# Patient Record
Sex: Female | Born: 1975 | Race: White | Hispanic: No | Marital: Single | Smoking: Never smoker
Health system: Southern US, Community
[De-identification: ages and names within clinical notes are randomized; demographics above are authoritative.]

## PROBLEM LIST (undated history)

## (undated) DIAGNOSIS — D649 Anemia, unspecified: Secondary | ICD-10-CM

## (undated) DIAGNOSIS — R519 Headache, unspecified: Secondary | ICD-10-CM

## (undated) DIAGNOSIS — R51 Headache: Secondary | ICD-10-CM

## (undated) HISTORY — DX: Headache, unspecified: R51.9

## (undated) HISTORY — DX: Headache: R51

## (undated) HISTORY — DX: Anemia, unspecified: D64.9

---

## 2017-09-04 DIAGNOSIS — G44209 Tension-type headache, unspecified, not intractable: Secondary | ICD-10-CM | POA: Diagnosis not present

## 2017-09-04 DIAGNOSIS — R42 Dizziness and giddiness: Secondary | ICD-10-CM | POA: Diagnosis not present

## 2017-09-04 DIAGNOSIS — H6980 Other specified disorders of Eustachian tube, unspecified ear: Secondary | ICD-10-CM | POA: Diagnosis not present

## 2017-09-17 ENCOUNTER — Encounter: Payer: Self-pay | Admitting: Medical

## 2017-09-17 ENCOUNTER — Ambulatory Visit (INDEPENDENT_AMBULATORY_CARE_PROVIDER_SITE_OTHER): Payer: BLUE CROSS/BLUE SHIELD | Admitting: Medical

## 2017-09-17 ENCOUNTER — Telehealth: Payer: Self-pay | Admitting: Medical

## 2017-09-17 VITALS — BP 107/49 | HR 72 | Temp 98.5°F | Resp 16 | Ht 66.5 in | Wt 120.8 lb

## 2017-09-17 DIAGNOSIS — S46811A Strain of other muscles, fascia and tendons at shoulder and upper arm level, right arm, initial encounter: Secondary | ICD-10-CM | POA: Diagnosis not present

## 2017-09-17 DIAGNOSIS — J3489 Other specified disorders of nose and nasal sinuses: Secondary | ICD-10-CM | POA: Diagnosis not present

## 2017-09-17 DIAGNOSIS — R51 Headache: Secondary | ICD-10-CM | POA: Diagnosis not present

## 2017-09-17 DIAGNOSIS — R42 Dizziness and giddiness: Secondary | ICD-10-CM | POA: Diagnosis not present

## 2017-09-17 DIAGNOSIS — R519 Headache, unspecified: Secondary | ICD-10-CM

## 2017-09-17 DIAGNOSIS — D649 Anemia, unspecified: Secondary | ICD-10-CM

## 2017-09-17 LAB — COMPREHENSIVE METABOLIC PANEL
ALT: 14 U/L (ref 0–35)
AST: 17 U/L (ref 0–37)
Albumin: 4 g/dL (ref 3.5–5.2)
Alkaline Phosphatase: 45 U/L (ref 39–117)
BUN: 8 mg/dL (ref 6–23)
CO2: 27 mEq/L (ref 19–32)
Calcium: 9.2 mg/dL (ref 8.4–10.5)
Chloride: 102 mEq/L (ref 96–112)
Creatinine, Ser: 0.64 mg/dL (ref 0.40–1.20)
GFR: 109.47 mL/min (ref >60.00)
Glucose, Bld: 96 mg/dL (ref 70–99)
Potassium: 4.5 mEq/L (ref 3.5–5.1)
Sodium: 135 mEq/L (ref 135–145)
Total Bilirubin: 0.2 mg/dL (ref 0.2–1.2)
Total Protein: 6.8 g/dL (ref 6.0–8.3)

## 2017-09-17 LAB — CBC WITH DIFFERENTIAL/PLATELET
Basophils Absolute: 0.1 10*3/uL (ref 0.0–0.1)
Basophils Relative: 0.9 % (ref 0.0–3.0)
Eosinophils Absolute: 0.1 10*3/uL (ref 0.0–0.7)
Eosinophils Relative: 0.7 % (ref 0.0–5.0)
HCT: 29.7 % — ABNORMAL LOW (ref 36.0–46.0)
Hemoglobin: 9.7 g/dL — ABNORMAL LOW (ref 12.0–15.0)
Lymphocytes Relative: 18.4 % (ref 12.0–46.0)
Lymphs Abs: 1.4 10*3/uL (ref 0.7–4.0)
MCHC: 32.6 g/dL (ref 30.0–36.0)
MCV: 80.9 fl (ref 78.0–100.0)
Monocytes Absolute: 0.6 10*3/uL (ref 0.1–1.0)
Monocytes Relative: 7.5 % (ref 3.0–12.0)
Neutro Abs: 5.5 10*3/uL (ref 1.4–7.7)
Neutrophils Relative %: 72.5 % (ref 43.0–77.0)
Platelets: 195 10*3/uL (ref 150.0–400.0)
RBC: 3.67 Mil/uL — ABNORMAL LOW (ref 3.87–5.11)
RDW: 17.1 % — ABNORMAL HIGH (ref 11.5–15.5)
WBC: 7.6 10*3/uL (ref 4.0–10.5)

## 2017-09-17 MED ORDER — MECLIZINE HCL 12.5 MG PO TABS: 12.5000 mg | ORAL_TABLET | Freq: Three times a day (TID) | ORAL | 0 refills | Status: AC | PRN

## 2017-09-17 MED ORDER — CYCLOBENZAPRINE HCL 5 MG PO TABS: 5.0000 mg | ORAL_TABLET | Freq: Every day | ORAL | 0 refills | Status: DC

## 2017-09-17 MED ORDER — DICLOFENAC SODIUM 75 MG PO TBEC: 75.0000 mg | DELAYED_RELEASE_TABLET | Freq: Two times a day (BID) | ORAL | 0 refills | Status: DC

## 2017-09-17 MED ORDER — KETOROLAC TROMETHAMINE 30 MG/ML IJ SOLN
30.0000 mg | Freq: Once | INTRAMUSCULAR | Status: AC
  Administered 2017-09-17: 30 mg via INTRAMUSCULAR

## 2017-09-17 NOTE — Telephone Encounter (Signed)
Future for anemia work up placed.

## 2017-09-17 NOTE — Progress Notes (Signed)
Subjective:    Patient ID: Sara Riley, female    DOB: 1975-11-11, 42 y.o.   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 Lives in [place] for 2 years. Recently stopped coffee over past month. Describes in past 2 pots of Coffee  a day. Now drinking 4 cups of coffee a day. More water recently. She trying to eat better. Pt used to go to gym 3 days a week but recently not. Fiancee. 3 children.(17, 14 and 24 yo).  Steffanie Rainwater recently had stroke. Pt is helping her fiancee with new business, full time job and 3 children. So recent increase in stress.  Pt getting close to 8 hours of sleep a night.  Pt in states she dizziness, ha and nausea  for about a month. She reports she has spinning room sensation Some nausea as well. Last menstrual cycle was Aug 22, 2017. History of [place].  The dizziness/vertigo has been coming and going. Some days has severe symptoms. But will get maybe 2 days free from vertigo then sympoms returns.   HA frequency same as above but will get dizziness first. She does report having some light and sound sensitivity.  Pt states her ears feel clogged. But at urgent care did not note any wax.  Some faint pressure sensation.  Pt in past would have very mild low level ha in past. States in past would get very mild low level ha in past. Rare and respond rapidly to excedrin.  She thinks hearing some decreased. Pt seen at [facility]. They gave advised zyrtec, butalbital/tylenol and motion sickness medicine.(walgreeens  brand medication otc)   Review of Systems  Constitutional: Negative for chills, fatigue and fever.  HENT: Negative for congestion, drooling and ear discharge.   Respiratory: Negative for cough, chest tightness, shortness of breath and wheezing.   Cardiovascular: Negative for chest pain and palpitations.  Gastrointestinal: Negative for abdominal distention, abdominal pain, blood in stool, constipation, diarrhea, nausea and vomiting.    Musculoskeletal: Negative for back pain, joint swelling and neck pain.  Skin: Negative for rash.  Neurological: Positive for dizziness, light-headedness and headaches. Negative for seizures, syncope and weakness.       3/10 level pain now. Was 8/10 level pain earlier today at home.  Hematological: Negative for adenopathy. Does not bruise/bleed easily.  Psychiatric/Behavioral: Negative for behavioral problems, dysphoric mood, sleep disturbance and suicidal ideas. The patient is not nervous/anxious.        Stress.    Past Medical History:  Diagnosis Date  . Anemia   . Headache    migraine in past.     Social History   Socioeconomic History  . Marital status: Single    Spouse name: Not on file  . Number of children: 2  . Years of education: Not on file  . Highest education level: Not on file  Occupational History  . Occupation: byer  Social Needs  . Financial resource strain: Not hard at all  . Food insecurity:    Worry: Never true    Inability: Never true  . Transportation needs:    Medical: No    Non-medical: No  Tobacco Use  . Smoking status: Never Smoker  . Smokeless tobacco: Never Used  Substance and Sexual Activity  . Alcohol use: Never    Frequency: Never  . Drug use: Never  . Sexual activity: Yes    Partners: Male    Comment: fiance  Lifestyle  . Physical activity:  Days per week: 3 days    Minutes per session: 60 min  . Stress: Rather much  Relationships  . Social connections:    Talks on phone: Never    Gets together: Once a week    Attends religious service: Never    Active member of club or organization: No    Attends meetings of clubs or organizations: Never    Relationship status: Living with partner  . Intimate partner violence:    Fear of current or ex partner: No    Emotionally abused: No    Physically abused: No    Forced sexual activity: No  Other Topics Concern  . Not on file  Social History Narrative  . Not on file    Past  Surgical History:  Procedure Laterality Date  . CESAREAN SECTION     2005/2007    Family History  Problem Relation Age of Onset  . Diabetes Father   . Heart failure Father   . Hyperlipidemia Father   . Hypertension Father   . Hearing loss Father   . Birth defects Daughter   . Hypertension Daughter   . Kidney disease Daughter     Not on File  Current Outpatient Medications on File Prior to Visit  Medication Sig Dispense Refill  . Butalbital-APAP-Caffeine 50-300-40 MG CAPS      No current facility-administered medications on file prior to visit.     BP (!) 107/49 (BP Location: Right Arm, Patient Position: Sitting, Cuff Size: Small)   Pulse 72   Temp 98.5 F (36.9 C) (Oral)   Resp 16   Ht 5' 6.5" (1.689 m)   Wt 120 lb 12.8 oz (54.8 kg)   SpO2 100%   BMI 19.21 kg/m       Objective:   Physical Exam   General Mental Status- Alert. General Appearance- Not in acute distress.   Skin General: Color- Normal Color. Moisture- Normal Moisture.  Neck Carotid Arteries- Normal color. Moisture- Normal Moisture. No carotid bruits. No JVD.  Chest and Lung Exam Auscultation: Breath Sounds:-Normal.  Cardiovascular Auscultation:Rythm- Regular. Murmurs & Other Heart Sounds:Auscultation of the heart reveals- No Murmurs.  Abdomen Inspection:-Inspeection Normal. Palpation/Percussion:Note:No mass. Palpation and Percussion of the abdomen reveal- Non Tender, Non Distended + BS, no rebound or guarding.  Neurologic Cranial Nerve exam:- CN III-XII intact(No nystagmus), symmetric smile. Drift Test:- No drift. Romberg Exam:- Negative.  Heal to Toe Gait exam:-Normal. Finger to Nose:- Normal/Intact Strength:- 5/5 equal and symmetric strength both upper and lower extremities. Vertigo lying supine when she turns head even minimally to left.    Assessment & Plan:  For your recent new onset headache with dizziness/vertigo, we gave you Toradol 30 mg IM injection.  Some of your  headache features are migraine headache light versus tension headache.  However since do not have significant history of headaches do want to proceed with caution and follow you closely.  Stop butalbital as you report that did not help.  Tonight before you sleep can use Flexeril 5 mg tablet.  This can help with tension headache.  Also for mild headaches during the day can use diclofenac.  Not to use other NSAIDs while using diclofenac.  For recent vertigo, I am providing you with head maneuver exercises.  Try to do these.  If you find that these are not tolerated then let me know and I could refer you to physical therapy for that maneuvers.   Please get CBC and CMP today.  You overall had  very good neurologic exam today except for vertigo when lying supine and attempting to turn her head to the left.  Will assess your response to the above treatment.  Since your headache features are relatively new I plan to get CT of head if you do not respond to treatment.  Please use Flonase nasal spray for ear pressure and will see if this helps with frontal sinus region pressure as well.  Use meclizine if needed for constant dizziness apart from head position changes.  Esperanza Richters, PA-C

## 2017-09-17 NOTE — Patient Instructions (Addendum)
For your recent new onset headache with dizziness/vertigo, we gave you Toradol 30 mg IM injection.  Some of your headache features are migraine headache light versus tension headache.  However since do not have significant history of headaches do want to proceed with caution and follow you closely.  Stop butalbital as you report that did not help.  Tonight before you sleep can use Flexeril 5 mg tablet.  This can help with tension headache.  Also for mild headaches during the day can use diclofenac.  Not to use other NSAIDs while using diclofenac.  For recent vertigo, I am providing you with head maneuver exercises.  Try to do these.  If you find that these are not tolerated then let me know and I could refer you to physical therapy for that maneuvers.  Please get CBC and CMP today.  You overall had very good neurologic exam today except for vertigo when lying supine and attempting to turn her head to the left.  Will assess your response to the above treatment.  Since your headache features are relatively new I plan to get CT of head if you do not respond to treatment.  Please use Flonase nasal spray for ear pressure and will see if this helps with frontal sinus region pressure as well.  Follow-up on Monday but please give me an update by Friday morning on how you are feeling.  If any severe headache with gross motor or neurologic deficits as described then recommend ED evaluation.   How to Perform the Epley Maneuver The Epley maneuver is an exercise that relieves symptoms of vertigo. Vertigo is the feeling that you or your surroundings are moving when they are not. When you feel vertigo, you may feel like the room is spinning and have trouble walking. Dizziness is a little different than vertigo. When you are dizzy, you may feel unsteady or light-headed. You can do this maneuver at home whenever you have symptoms of vertigo. You can do it up to 3 times a day until your symptoms go  away. Even though the Epley maneuver may relieve your vertigo for a few weeks, it is possible that your symptoms will return. This maneuver relieves vertigo, but it does not relieve dizziness. What are the risks? If it is done correctly, the Epley maneuver is considered safe. Sometimes it can lead to dizziness or nausea that goes away after a short time. If you develop other symptoms, such as changes in vision, weakness, or numbness, stop doing the maneuver and call your health care provider. How to perform the Epley maneuver 1. Sit on the edge of a bed or table with your back straight and your legs extended or hanging over the edge of the bed or table. 2. Turn your head halfway toward the affected ear or side. 3. Lie backward quickly with your head turned until you are lying flat on your back. You may want to position a pillow under your shoulders. 4. Hold this position for 30 seconds. You may experience an attack of vertigo. This is normal. 5. Turn your head to the opposite direction until your unaffected ear is facing the floor. 6. Hold this position for 30 seconds. You may experience an attack of vertigo. This is normal. Hold this position until the vertigo stops. 7. Turn your whole body to the same side as your head. Hold for another 30 seconds. 8. Sit back up. You can repeat this exercise up to 3 times a day. Follow these instructions  at home:  After doing the Epley maneuver, you can return to your normal activities.  Ask your health care provider if there is anything you should do at home to prevent vertigo. He or she may recommend that you: ? Keep your head raised (elevated) with two or more pillows while you sleep. ? Do not sleep on the side of your affected ear. ? Get up slowly from bed. ? Avoid sudden movements during the day. ? Avoid extreme head movement, like looking up or bending over. Contact a health care provider if:  Your vertigo gets worse.  You have other symptoms,  including: ? Nausea. ? Vomiting. ? Headache. Get help right away if:  You have vision changes.  You have a severe or worsening headache or neck pain.  You cannot stop vomiting.  You have new numbness or weakness in any part of your body. Summary  Vertigo is the feeling that you or your surroundings are moving when they are not.  The Epley maneuver is an exercise that relieves symptoms of vertigo.  If the Epley maneuver is done correctly, it is considered safe. You can do it up to 3 times a day. This information is not intended to replace advice given to you by your health care provider. Make sure you discuss any questions you have with your health care provider. Document Released: 03/30/2013 Document Revised: 02/13/2016 Document Reviewed: 02/13/2016 Elsevier Interactive Patient Education  2017 [employer].

## 2017-09-18 ENCOUNTER — Encounter: Payer: Self-pay | Admitting: Medical

## 2017-09-19 ENCOUNTER — Telehealth: Payer: Self-pay | Admitting: Medical

## 2017-09-19 ENCOUNTER — Encounter: Payer: Self-pay | Admitting: Medical

## 2017-09-19 MED ORDER — ONDANSETRON 4 MG PO TBDP: 4.0000 mg | ORAL_TABLET | Freq: Three times a day (TID) | ORAL | 0 refills | Status: DC | PRN

## 2017-09-19 NOTE — Telephone Encounter (Signed)
Did talk to patient. Reiterated my chart message. She mentioned some nausea ealier. Did send in zofran to pharmacy.

## 2017-09-19 NOTE — Telephone Encounter (Signed)
Copied from CRM [phone]. Topic: Quick Communication - See Telephone Encounter >> Sep 19, 2017  1:11 PM Lorrine KinMcGee, Moataz Tavis B, NT wrote: CRM for notification. See Telephone encounter for: 09/19/17. Patient calling and states that she was at work today and only made it for 3 hours before leaving. Would like a call back to discuss whether she needs to continue taking the medications meclizine (ANTIVERT) 12.5 MG tablet  and diclofenac (VOLTAREN) 75 MG EC tablet . States that she can not even keep down water. She does have CT scheduled for Monday afternoon.  CB#: [phone]

## 2017-09-19 NOTE — Telephone Encounter (Signed)
Will you call patient and see how she is doing? I may order ct of head stat for her depending on how she did over weekend. Please call her early on Monday June 17,2019.

## 2017-09-19 NOTE — Telephone Encounter (Signed)
I talked with pt tonight. Will you call her on Monday morning and see how she is? Plan to order Ct of head if did not do well over weekend.

## 2017-09-22 ENCOUNTER — Ambulatory Visit: Payer: BLUE CROSS/BLUE SHIELD | Admitting: Medical

## 2017-09-23 ENCOUNTER — Ambulatory Visit: Payer: BLUE CROSS/BLUE SHIELD | Admitting: Medical

## 2017-09-23 DIAGNOSIS — Z0289 Encounter for other administrative examinations: Secondary | ICD-10-CM

## 2017-09-27 ENCOUNTER — Encounter: Payer: Self-pay | Admitting: Medical

## 2017-10-02 ENCOUNTER — Telehealth: Payer: Self-pay

## 2017-10-03 NOTE — Telephone Encounter (Signed)
I do not see a message.  

## 2017-10-27 ENCOUNTER — Ambulatory Visit: Payer: Self-pay | Admitting: Medical

## 2017-10-27 ENCOUNTER — Encounter (HOSPITAL_BASED_OUTPATIENT_CLINIC_OR_DEPARTMENT_OTHER): Payer: Self-pay

## 2017-10-27 ENCOUNTER — Emergency Department (HOSPITAL_BASED_OUTPATIENT_CLINIC_OR_DEPARTMENT_OTHER): Payer: BLUE CROSS/BLUE SHIELD

## 2017-10-27 ENCOUNTER — Other Ambulatory Visit: Payer: Self-pay

## 2017-10-27 ENCOUNTER — Emergency Department (HOSPITAL_BASED_OUTPATIENT_CLINIC_OR_DEPARTMENT_OTHER)
Admission: EM | Admit: 2017-10-27 | Discharge: 2017-10-27 | Disposition: A | Discharge status: Home / Self Care | Payer: BLUE CROSS/BLUE SHIELD | Attending: Emergency Medicine | Admitting: Emergency Medicine

## 2017-10-27 DIAGNOSIS — Z79899 Other long term (current) drug therapy: Secondary | ICD-10-CM | POA: Insufficient documentation

## 2017-10-27 DIAGNOSIS — R519 Headache, unspecified: Secondary | ICD-10-CM

## 2017-10-27 DIAGNOSIS — R51 Headache: Secondary | ICD-10-CM | POA: Diagnosis not present

## 2017-10-27 DIAGNOSIS — R42 Dizziness and giddiness: Secondary | ICD-10-CM | POA: Diagnosis not present

## 2017-10-27 DIAGNOSIS — R197 Diarrhea, unspecified: Secondary | ICD-10-CM | POA: Diagnosis not present

## 2017-10-27 DIAGNOSIS — R112 Nausea with vomiting, unspecified: Secondary | ICD-10-CM | POA: Diagnosis not present

## 2017-10-27 MED ORDER — SODIUM CHLORIDE 0.9 % IV BOLUS
500.0000 mL | Freq: Once | INTRAVENOUS | Status: AC
  Administered 2017-10-27: 500 mL via INTRAVENOUS

## 2017-10-27 MED ORDER — ONDANSETRON 4 MG PO TBDP: 4.0000 mg | ORAL_TABLET | Freq: Three times a day (TID) | ORAL | 0 refills | Status: AC | PRN

## 2017-10-27 MED ORDER — KETOROLAC TROMETHAMINE 15 MG/ML IJ SOLN
15.0000 mg | Freq: Once | INTRAMUSCULAR | Status: AC
  Administered 2017-10-27: 15 mg via INTRAVENOUS
  Filled 2017-10-27: qty 1

## 2017-10-27 MED ORDER — DEXAMETHASONE 6 MG PO TABS
10.0000 mg | ORAL_TABLET | Freq: Once | ORAL | Status: AC
  Administered 2017-10-27: 10 mg via ORAL
  Filled 2017-10-27: qty 1

## 2017-10-27 MED ORDER — METOCLOPRAMIDE HCL 5 MG/ML IJ SOLN
10.0000 mg | Freq: Once | INTRAMUSCULAR | Status: AC
  Administered 2017-10-27: 10 mg via INTRAVENOUS
  Filled 2017-10-27: qty 2

## 2017-10-27 MED ORDER — CYCLOBENZAPRINE HCL 5 MG PO TABS: 5.0000 mg | ORAL_TABLET | Freq: Two times a day (BID) | ORAL | 0 refills | Status: DC | PRN

## 2017-10-27 MED ORDER — MECLIZINE HCL 25 MG PO TABS
25.0000 mg | ORAL_TABLET | Freq: Once | ORAL | Status: AC
  Administered 2017-10-27: 25 mg via ORAL
  Filled 2017-10-27: qty 1

## 2017-10-27 NOTE — ED Triage Notes (Signed)
Pt states she woke at 230am with HA, n/v/v-to triage in w/c-NAD-steady gait

## 2017-10-27 NOTE — ED Notes (Signed)
ED Provider at bedside. 

## 2017-10-27 NOTE — Telephone Encounter (Signed)
Pt stated that she is having the worst migraine she has ever had. The migraine woke her up at 2:30 this am. She stated that her head is spinning and when she slept she had to sleep on the floor. She stated her Migraine is "greater than a 10/10". She is having vomiting and diarrhea. She stated that she was dry heaving from 0230-0700. Pt stated it feels like her head is a "bowling ball" and she is having difficulty with raising her head . Pt advised to call 911 to take her to the ED. She was given and understood care advice.   Reason for Disposition . [1] SEVERE headache (e.g., excruciating) AND [2] "worst headache" of life . [1] SEVERE headache AND [2] sudden-onset (i.e., reaching maximum intensity within seconds)  Answer Assessment - Initial Assessment Questions 1. LOCATION: "Where does it hurt?"  Head hurts everywhere 2. ONSET: "When did the headache start?" (Minutes, hours or days)      0230 woke pt up 3. PATTERN: "Does the pain come and go, or has it been constant since it started?"     constant 4. SEVERITY: "How bad is the pain?" and "What does it keep you from doing?"  (e.g., Scale 1-10; mild, moderate, or severe)   - MILD (1-3): doesn't interfere with normal activities    - MODERATE (4-7): interferes with normal activities or awakens from sleep    - SEVERE (8-10): excruciating pain, unable to do any normal activities        Severe (beyond 10")- worst headache of her life 5. RECURRENT SYMPTOM: "Have you ever had headaches before?" If so, ask: "When was the last time?" and "What happened that time?"      Yes-month and a half ago 6. CAUSE: "What do you think is causing the headache?"     Pt does not know 7. MIGRAINE: "Have you been diagnosed with migraine headaches?" If so, ask: "Is this headache similar?"      Yes- yes 8. HEAD INJURY: "Has there been any recent injury to the head?"      no 9. OTHER SYMPTOMS: "Do you have any other symptoms?" (fever, stiff neck, eye pain, sore throat,  cold symptoms)     Feels like head is spinning, room is spinning -feels like her head is a bowling ball, nausea and vomiting 10. PREGNANCY: "Is there any chance you are pregnant?" "When was your last menstrual period?"       No- LMP yesterday  Protocols used: HEADACHE-A-AH

## 2017-10-27 NOTE — ED Provider Notes (Signed)
[facility] EMERGENCY DEPARTMENT Provider Note   CSN: [id number] Arrival date & time: 10/27/17  1133     History   Chief Complaint Chief Complaint  Patient presents with  . Headache    HPI Sara Riley is a 42 y.o. female.  The history is provided by the patient. No language interpreter was used.  Headache     Sara Riley is a 42 y.o. female who presents to the Emergency Department complaining of HA. Presents to the emergency department complaining of headache that woke her from sleep at 230 this morning. She reports a pain throughout her head in the back of her neck. It's an onset with associated vomiting, diarrhea, nausea, dizziness. She has experienced similar headaches that began around April of this year. She denies any fevers, abdominal pain, numbness, vision changes. She tried Excedrin migraine with no improvement in her symptoms. She has not had any imaging of her brain. Past Medical History:  Diagnosis Date  . Anemia   . Headache    migraine in past.    There are no active problems to display for this patient.   Past Surgical History:  Procedure Laterality Date  . CESAREAN SECTION     2005/2007     OB History   None      Home Medications    Prior to Admission medications   Medication Sig Start Date End Date Taking? Authorizing Provider  Butalbital-APAP-Caffeine 50-300-40 MG CAPS  09/06/17   [provider]  cyclobenzaprine (FLEXERIL) 5 MG tablet Take 1 tablet (5 mg total) by mouth 2 (two) times daily as needed for muscle spasms. 10/27/17   Tilden Fossaees, Daymein Nunnery, MD  diclofenac (VOLTAREN) 75 MG EC tablet Take 1 tablet (75 mg total) by mouth 2 (two) times daily. 09/17/17   Saguier, Ramon DredgeEdward, PA-C  meclizine (ANTIVERT) 12.5 MG tablet Take 1 tablet (12.5 mg total) by mouth 3 (three) times daily as needed for dizziness. 09/17/17   Saguier, Ramon DredgeEdward, PA-C  ondansetron (ZOFRAN ODT) 4 MG disintegrating tablet Take 1 tablet (4 mg total) by mouth every 8  (eight) hours as needed for nausea or vomiting. 10/27/17   Tilden Fossaees, Estefano Victory, MD    Family History Family History  Problem Relation Age of Onset  . Diabetes Father   . Heart failure Father   . Hyperlipidemia Father   . Hypertension Father   . Hearing loss Father   . Birth defects Daughter   . Hypertension Daughter   . Kidney disease Daughter     Social History Social History   Tobacco Use  . Smoking status: Never Smoker  . Smokeless tobacco: Never Used  Substance Use Topics  . Alcohol use: Never    Frequency: Never  . Drug use: Never     Allergies   Phenergan [promethazine]   Review of Systems Review of Systems  Neurological: Positive for headaches.  All other systems reviewed and are negative.    Physical Exam Updated Vital Signs BP 105/60 (BP Location: Right Arm)   Pulse 81   Temp 98.1 F (36.7 C) (Oral)   Resp 16   Ht 5\' 6"  (1.676 m)   Wt 54.4 kg (120 lb)   LMP 10/26/2017   SpO2 100%   BMI 19.37 kg/m   Physical Exam  Constitutional: She is oriented to person, place, and time. She appears well-developed and well-nourished.  HENT:  Head: Normocephalic and atraumatic.  TMs clear bilaterally. Oropharynx without erythema or edema.  Eyes: Pupils are equal,  round, and reactive to light. EOM are normal.  Neck:  No carotid bruits  Cardiovascular: Normal rate and regular rhythm.  No murmur heard. Pulmonary/Chest: Effort normal and breath sounds normal. No respiratory distress.  Abdominal: Soft. There is no tenderness. There is no rebound and no guarding.  Musculoskeletal: She exhibits no edema or tenderness.  Neurological: She is alert and oriented to person, place, and time. No cranial nerve deficit. Coordination normal.  5/5 strength in all four extremities.   Skin: Skin is warm and dry.  Psychiatric: She has a normal mood and affect. Her behavior is normal.  Nursing note and vitals reviewed.    ED Treatments / Results  Labs (all labs ordered are  listed, but only abnormal results are displayed) Labs Reviewed - No data to display  EKG None  Radiology Ct Head Wo Contrast  Result Date: 10/27/2017 CLINICAL DATA:  Migraine headache and vomiting. EXAM: CT HEAD WITHOUT CONTRAST TECHNIQUE: Contiguous axial images were obtained from the base of the skull through the vertex without intravenous contrast. COMPARISON:  None. FINDINGS: Brain: No evidence of acute infarction, hemorrhage, hydrocephalus, extra-axial collection or mass lesion/mass effect. Vascular: No hyperdense vessel or unexpected calcification. Skull: Normal. Negative for fracture or focal lesion. Sinuses/Orbits: No acute finding. Other: None. IMPRESSION: Normal head CT. Electronically Signed   By: Irish Lack M.D.   On: 10/27/2017 13:50    Procedures Procedures (including critical care time)  Medications Ordered in ED Medications  sodium chloride 0.9 % bolus 500 mL (0 mLs Intravenous Stopped 10/27/17 1423)  metoCLOPramide (REGLAN) injection 10 mg (10 mg Intravenous Given 10/27/17 1333)  meclizine (ANTIVERT) tablet 25 mg (25 mg Oral Given 10/27/17 1355)  ketorolac (TORADOL) 15 MG/ML injection 15 mg (15 mg Intravenous Given 10/27/17 1421)     Initial Impression / Assessment and Plan / ED Course  I have reviewed the triage vital signs and the nursing notes.  Pertinent labs & imaging results that were available during my care of the patient were reviewed by me and considered in my medical decision making (see chart for details).     Patient here for evaluation of recurrent headache with dizziness and vomiting. She is non-toxic appearing on examination with no focal neurologic deficits. Given new onset of unusual headaches since April CT scan of her head was obtained. CT is negative for any acute abnormality. Current presentation is not consistent with subarachnoid hemorrhage, dural sinus thrombosis, CVA, meningitis. Following treatment in the department her symptoms are  significantly improved. Plan to DC home with outpatient follow-up and return precautions.  Final Clinical Impressions(s) / ED Diagnoses   Final diagnoses:  Bad headache    ED Discharge Orders        Ordered    ondansetron (ZOFRAN ODT) 4 MG disintegrating tablet  Every 8 hours PRN     10/27/17 1442    cyclobenzaprine (FLEXERIL) 5 MG tablet  2 times daily PRN     10/27/17 1442       Tilden Fossa, MD 10/27/17 1444

## 2017-11-11 DIAGNOSIS — G43009 Migraine without aura, not intractable, without status migrainosus: Secondary | ICD-10-CM | POA: Diagnosis not present

## 2017-11-11 DIAGNOSIS — R42 Dizziness and giddiness: Secondary | ICD-10-CM | POA: Diagnosis not present

## 2017-11-11 DIAGNOSIS — G43109 Migraine with aura, not intractable, without status migrainosus: Secondary | ICD-10-CM | POA: Diagnosis not present

## 2018-01-14 ENCOUNTER — Ambulatory Visit: Payer: Self-pay | Admitting: Family Medicine

## 2018-05-21 DIAGNOSIS — Z124 Encounter for screening for malignant neoplasm of cervix: Secondary | ICD-10-CM | POA: Diagnosis not present

## 2018-05-21 DIAGNOSIS — Z832 Family history of diseases of the blood and blood-forming organs and certain disorders involving the immune mechanism: Secondary | ICD-10-CM | POA: Diagnosis not present

## 2018-05-21 DIAGNOSIS — Z1151 Encounter for screening for human papillomavirus (HPV): Secondary | ICD-10-CM | POA: Diagnosis not present

## 2018-05-21 DIAGNOSIS — N921 Excessive and frequent menstruation with irregular cycle: Secondary | ICD-10-CM | POA: Diagnosis not present

## 2018-05-21 DIAGNOSIS — N939 Abnormal uterine and vaginal bleeding, unspecified: Secondary | ICD-10-CM | POA: Diagnosis not present

## 2018-05-21 DIAGNOSIS — Z01419 Encounter for gynecological examination (general) (routine) without abnormal findings: Secondary | ICD-10-CM | POA: Diagnosis not present

## 2018-05-21 DIAGNOSIS — D509 Iron deficiency anemia, unspecified: Secondary | ICD-10-CM | POA: Diagnosis not present

## 2018-05-23 ENCOUNTER — Encounter (HOSPITAL_BASED_OUTPATIENT_CLINIC_OR_DEPARTMENT_OTHER): Payer: Self-pay | Admitting: *Deleted

## 2018-05-23 ENCOUNTER — Emergency Department (HOSPITAL_BASED_OUTPATIENT_CLINIC_OR_DEPARTMENT_OTHER)
Admission: EM | Admit: 2018-05-23 | Discharge: 2018-05-23 | Disposition: A | Discharge status: Home / Self Care | Payer: BLUE CROSS/BLUE SHIELD | Attending: Emergency Medicine | Admitting: Emergency Medicine

## 2018-05-23 ENCOUNTER — Other Ambulatory Visit: Payer: Self-pay

## 2018-05-23 DIAGNOSIS — M62830 Muscle spasm of back: Secondary | ICD-10-CM | POA: Insufficient documentation

## 2018-05-23 DIAGNOSIS — Z79899 Other long term (current) drug therapy: Secondary | ICD-10-CM | POA: Insufficient documentation

## 2018-05-23 DIAGNOSIS — M545 Low back pain, unspecified: Secondary | ICD-10-CM

## 2018-05-23 MED ORDER — LIDOCAINE 5 % EX PTCH: 1.0000 | MEDICATED_PATCH | CUTANEOUS | 0 refills | Status: AC

## 2018-05-23 MED ORDER — CYCLOBENZAPRINE HCL 5 MG PO TABS: 5.0000 mg | ORAL_TABLET | Freq: Two times a day (BID) | ORAL | 0 refills | Status: DC | PRN

## 2018-05-23 MED ORDER — KETOROLAC TROMETHAMINE 30 MG/ML IJ SOLN
15.0000 mg | Freq: Once | INTRAMUSCULAR | Status: AC
  Administered 2018-05-23: 15 mg via INTRAMUSCULAR
  Filled 2018-05-23: qty 1

## 2018-05-23 MED ORDER — IBUPROFEN 600 MG PO TABS: 600.0000 mg | ORAL_TABLET | Freq: Four times a day (QID) | ORAL | 0 refills | Status: AC | PRN

## 2018-05-23 NOTE — ED Notes (Signed)
Heating pad applied to lower back.

## 2018-05-23 NOTE — ED Provider Notes (Signed)
[facility] EMERGENCY DEPARTMENT Provider Note   CSN: [id number] Arrival date & time: 05/23/18  1437     History   Chief Complaint Chief Complaint  Patient presents with  . Back Pain    HPI Sara Riley is a 43 y.o. female with history of migraines who presents with low back pain that began after sneezing.  She reports it dropped her to the ground.  She denies any numbness or tingling, saddle anesthesia, bowel or bladder incontinence, history of back procedures, history of cancer or IVDU, fevers.  Patient took Aleve around 10:30 AM when it happened.  She is also applied icy hot.  Patient denies any urinary symptoms.  She has a tubal ligation.  HPI  Past Medical History:  Diagnosis Date  . Anemia   . Headache    migraine in past.    There are no active problems to display for this patient.   Past Surgical History:  Procedure Laterality Date  . CESAREAN SECTION     2005/2007     OB History   No obstetric history on file.      Home Medications    Prior to Admission medications   Medication Sig Start Date End Date Taking? Authorizing Provider  Butalbital-APAP-Caffeine 50-300-40 MG CAPS  09/06/17   [provider]  cyclobenzaprine (FLEXERIL) 5 MG tablet Take 1 tablet (5 mg total) by mouth 2 (two) times daily as needed for muscle spasms. 05/23/18   Leotha Voeltz, Waylan BogaAlexandra M, PA-C  diclofenac (VOLTAREN) 75 MG EC tablet Take 1 tablet (75 mg total) by mouth 2 (two) times daily. 09/17/17   Saguier, Ramon DredgeEdward, PA-C  ibuprofen (ADVIL,MOTRIN) 600 MG tablet Take 1 tablet (600 mg total) by mouth every 6 (six) hours as needed. 05/23/18   Varick Keys, Waylan BogaAlexandra M, PA-C  lidocaine (LIDODERM) 5 % Place 1 patch onto the skin daily. Remove & Discard patch within 12 hours or as directed by MD 05/23/18   Conni ElliotLaw, Waylan BogaAlexandra M, PA-C  meclizine (ANTIVERT) 12.5 MG tablet Take 1 tablet (12.5 mg total) by mouth 3 (three) times daily as needed for dizziness. 09/17/17   Saguier, Ramon DredgeEdward, PA-C  ondansetron  (ZOFRAN ODT) 4 MG disintegrating tablet Take 1 tablet (4 mg total) by mouth every 8 (eight) hours as needed for nausea or vomiting. 10/27/17   Tilden Fossaees, Elizabeth, MD    Family History Family History  Problem Relation Age of Onset  . Diabetes Father   . Heart failure Father   . Hyperlipidemia Father   . Hypertension Father   . Hearing loss Father   . Birth defects Daughter   . Hypertension Daughter   . Kidney disease Daughter     Social History Social History   Tobacco Use  . Smoking status: Never Smoker  . Smokeless tobacco: Never Used  Substance Use Topics  . Alcohol use: Never    Frequency: Never  . Drug use: Never     Allergies   Acetaminophen; Hydrocodone; Penicillin g; Phenergan [promethazine]; and Propoxyphene   Review of Systems Review of Systems  Constitutional: Negative for chills and fever.  HENT: Negative for facial swelling and sore throat.   Respiratory: Negative for shortness of breath.   Cardiovascular: Negative for chest pain.  Gastrointestinal: Negative for abdominal pain, nausea and vomiting.  Genitourinary: Negative for dysuria.  Musculoskeletal: Positive for back pain.  Skin: Negative for rash and wound.  Neurological: Negative for numbness and headaches.  Psychiatric/Behavioral: The patient is not nervous/anxious.  Physical Exam Updated Vital Signs BP (!) 107/54 (BP Location: Right Arm)   Pulse 86   Temp 98.2 F (36.8 C) (Oral)   Resp 18   Ht 5\' 5"  (1.651 m)   Wt 57.2 kg   SpO2 97%   BMI 20.97 kg/m   Physical Exam Vitals signs and nursing note reviewed.  Constitutional:      General: She is not in acute distress.    Appearance: She is well-developed. She is not diaphoretic.  HENT:     Head: Normocephalic and atraumatic.     Mouth/Throat:     Pharynx: No oropharyngeal exudate.  Eyes:     General: No scleral icterus.       Right eye: No discharge.        Left eye: No discharge.     Conjunctiva/sclera: Conjunctivae normal.      Pupils: Pupils are equal, round, and reactive to light.  Neck:     Musculoskeletal: Normal range of motion and neck supple.     Thyroid: No thyromegaly.  Cardiovascular:     Rate and Rhythm: Normal rate and regular rhythm.     Heart sounds: Normal heart sounds. No murmur. No friction rub. No gallop.   Pulmonary:     Effort: Pulmonary effort is normal. No respiratory distress.     Breath sounds: Normal breath sounds. No stridor. No wheezing or rales.  Abdominal:     General: Bowel sounds are normal. There is no distension.     Palpations: Abdomen is soft.     Tenderness: There is no abdominal tenderness. There is no guarding or rebound.  Musculoskeletal:       Back:  Lymphadenopathy:     Cervical: No cervical adenopathy.  Skin:    General: Skin is warm and dry.     Coloration: Skin is not pale.     Findings: No rash.  Neurological:     Mental Status: She is alert.     Coordination: Coordination normal.     Comments: Normal sensation throughout; 5/5 strength in bilateral lower extremities      ED Treatments / Results  Labs (all labs ordered are listed, but only abnormal results are displayed) Labs Reviewed - No data to display  EKG None  Radiology No results found.  Procedures Procedures (including critical care time)  Medications Ordered in ED Medications  ketorolac (TORADOL) 30 MG/ML injection 15 mg (15 mg Intramuscular Given 05/23/18 1521)     Initial Impression / Assessment and Plan / ED Course  I have reviewed the triage vital signs and the nursing notes.  Pertinent labs & imaging results that were available during my care of the patient were reviewed by me and considered in my medical decision making (see chart for details).     Patient with back pain after sneezing.  Patient found to have lumbar spasm.  No neurological deficits and normal neuro exam.  No trauma indicating imaging at this time.  Patient is ambulatory.  No loss of bowel or bladder  control.  No concern for cauda equina.  No fever, night sweats, weight loss, h/o cancer, IVDA, no recent procedure to back. No urinary symptoms suggestive of UTI.  Supportive care and return precaution discussed.  Patient had some improvement with IM Toradol in the ED.  Will discharge home with ibuprofen instead of Aleve, lidocaine patches, and Flexeril.  Patient to follow-up with her PCP if symptoms are not improving over the next few days.  Patient understands  and agrees with plan.  Patient vital stable throughout ED course and discharged in satisfactory condition.  Final Clinical Impressions(s) / ED Diagnoses   Final diagnoses:  Acute bilateral low back pain without sciatica  Muscle spasm of back    ED Discharge Orders         Ordered    cyclobenzaprine (FLEXERIL) 5 MG tablet  2 times daily PRN     05/23/18 1553    ibuprofen (ADVIL,MOTRIN) 600 MG tablet  Every 6 hours PRN     05/23/18 1553    lidocaine (LIDODERM) 5 %  Every 24 hours     05/23/18 1554           Emi Holes, PA-C 05/23/18 1557    Maia Plan, MD 05/23/18 2008

## 2018-05-23 NOTE — Discharge Instructions (Signed)
Take ibuprofen every 6 hours as needed for your pain.  Take Flexeril twice daily as needed for muscle spasms.  Do not drive or operate machinery while taking this medication use ice and heat alternating 20 minutes on, 20 minutes off.  Attempt the stretches and exercises we discussed a few times daily after eating.  Please follow-up with your doctor if your symptoms are not improving over the next few days.  Try to stay as active as possible.  Please return the emergency department you develop any new or worsening symptoms including complete numbness of your legs or groin, loss of bowel or bladder control, or any other new or concerning symptoms.

## 2018-05-23 NOTE — ED Notes (Signed)
ED Provider at bedside. 

## 2018-05-23 NOTE — ED Triage Notes (Signed)
Pt reports she sneezed this morning and since then she has been having severe back pain

## 2018-06-05 DIAGNOSIS — N858 Other specified noninflammatory disorders of uterus: Secondary | ICD-10-CM | POA: Diagnosis not present

## 2018-06-05 DIAGNOSIS — N939 Abnormal uterine and vaginal bleeding, unspecified: Secondary | ICD-10-CM | POA: Diagnosis not present

## 2018-06-05 DIAGNOSIS — N946 Dysmenorrhea, unspecified: Secondary | ICD-10-CM | POA: Diagnosis not present

## 2018-06-05 DIAGNOSIS — N921 Excessive and frequent menstruation with irregular cycle: Secondary | ICD-10-CM | POA: Diagnosis not present

## 2018-06-05 DIAGNOSIS — Z3202 Encounter for pregnancy test, result negative: Secondary | ICD-10-CM | POA: Diagnosis not present

## 2018-06-24 DIAGNOSIS — Z1239 Encounter for other screening for malignant neoplasm of breast: Secondary | ICD-10-CM | POA: Diagnosis not present

## 2018-06-24 DIAGNOSIS — Z1231 Encounter for screening mammogram for malignant neoplasm of breast: Secondary | ICD-10-CM | POA: Diagnosis not present

## 2018-09-01 ENCOUNTER — Ambulatory Visit (INDEPENDENT_AMBULATORY_CARE_PROVIDER_SITE_OTHER): Payer: BLUE CROSS/BLUE SHIELD | Admitting: Medical

## 2018-09-01 ENCOUNTER — Encounter: Payer: Self-pay | Admitting: Medical

## 2018-09-01 ENCOUNTER — Other Ambulatory Visit: Payer: Self-pay

## 2018-09-01 VITALS — Ht 65.0 in | Wt 130.0 lb

## 2018-09-01 DIAGNOSIS — J301 Allergic rhinitis due to pollen: Secondary | ICD-10-CM | POA: Diagnosis not present

## 2018-09-01 MED ORDER — FLUTICASONE PROPIONATE 50 MCG/ACT NA SUSP: 2.0000 | Freq: Every day | NASAL | 1 refills | Status: DC

## 2018-09-01 MED ORDER — LEVOCETIRIZINE DIHYDROCHLORIDE 5 MG PO TABS: 5.0000 mg | ORAL_TABLET | Freq: Every evening | ORAL | 0 refills | Status: DC

## 2018-09-01 MED ORDER — MONTELUKAST SODIUM 10 MG PO TABS: 10.0000 mg | ORAL_TABLET | Freq: Every day | ORAL | 3 refills | Status: DC

## 2018-09-01 NOTE — Progress Notes (Addendum)
Subjective:    Patient ID: Sara Riley, female    DOB: 1975/08/18, 43 y.o.   MRN: [id number]  HPI  Virtual Visit via Video Note  I connected with Sara Riley on 09/01/18 at  4:00 PM EDT by a video enabled telemedicine application and verified that I am speaking with the correct person using two identifiers.  Location: Patient: home Provider: home.  Pt has no way to check her bp or pulse.  Virtual visit done due to pandemic.   I discussed the limitations of evaluation and management by telemedicine and the availability of in person appointments. The patient expressed understanding and agreed to proceed.   History of Present Illness:  Pt in states she has some sinus pressure. She states will get nasal congested daily and having to breath through her mouth. She is sneezing some days non stop. She has been alternating claritin and zyrtec. In February she pulled pack muscle sneezing.  Not blowing out any mucus from nose. No present sinus pain.    Observations/Objective: General-no acute distress, pleasant, oriented. Lungs- on inspection lungs appear unlabored. Neck- no tracheal deviation or jvd on inspection. Neuro- gross motor function appears intact.   Assessment and Plan: For allergic rhinitis, I rx'd xyzal, motelukast and flonase nasal spray.  No sinus pressure presently so doubt sinus infection.  I do think triple combination therapy will be helpful. If not might arrange for depomedrol injection or allergist appointment,.  Follow up in 10 days or as needed   Esperanza RichtersEdward Odilon Cass, PA-C  Follow Up Instructions:    I discussed the assessment and treatment plan with the patient. The patient was provided an opportunity to ask questions and all were answered. The patient agreed with the plan and demonstrated an understanding of the instructions.   The patient was advised to call back or seek an in-person evaluation if the symptoms worsen or if the condition fails to  improve as anticipated.  I provided 15 minutes of non-face-to-face time during this encounter.   Esperanza RichtersEdward Carman Essick, PA-C   Review of Systems  Constitutional: Negative for chills, fatigue and fever.  HENT: Positive for congestion, postnasal drip and sneezing. Negative for ear pain, sinus pressure and sinus pain.   Respiratory: Negative for cough, shortness of breath and wheezing.   Cardiovascular: Negative for chest pain and palpitations.  Gastrointestinal: Negative for abdominal pain.  Musculoskeletal: Negative for back pain.  Neurological: Negative for dizziness, speech difficulty, weakness and headaches.  Hematological: Negative for adenopathy. Does not bruise/bleed easily.  Psychiatric/Behavioral: Negative for behavioral problems and confusion.   Past Medical History:  Diagnosis Date   Anemia    Headache    migraine in past.     Social History   Socioeconomic History   Marital status: Single    Spouse name: Not on file   Number of children: 2   Years of education: Not on file   Highest education level: Not on file  Occupational History   Occupation: [employer]  Ecologistocial Needs   Financial resource strain: Not hard at all   Food insecurity:    Worry: Never true    Inability: Never true   Transportation needs:    Medical: No    Non-medical: No  Tobacco Use   Smoking status: Never Smoker   Smokeless tobacco: Never Used  Substance and Sexual Activity   Alcohol use: Never    Frequency: Never   Drug use: Never   Sexual activity: Not on file  Lifestyle  Physical activity:    Days per week: 3 days    Minutes per session: 60 min   Stress: Rather much  Relationships   Social connections:    Talks on phone: Never    Gets together: Once a week    Attends religious service: Never    Active member of club or organization: No    Attends meetings of clubs or organizations: Never    Relationship status: Living with partner   Intimate partner violence:     Fear of current or ex partner: No    Emotionally abused: No    Physically abused: No    Forced sexual activity: No  Other Topics Concern   Not on file  Social History Narrative   Not on file    Past Surgical History:  Procedure Laterality Date   CESAREAN SECTION     2005/2007    Family History  Problem Relation Age of Onset   Diabetes Father    Heart failure Father    Hyperlipidemia Father    Hypertension Father    Hearing loss Father    Birth defects Daughter    Hypertension Daughter    Kidney disease Daughter     Allergies  Allergen Reactions   Acetaminophen Other (See Comments)   Hydrocodone Other (See Comments)   Penicillin G Other (See Comments)   Phenergan [Promethazine]     tremors   Propoxyphene Other (See Comments)    Current Outpatient Medications on File Prior to Visit  Medication Sig Dispense Refill   Butalbital-APAP-Caffeine 50-300-40 MG CAPS      cyclobenzaprine (FLEXERIL) 5 MG tablet Take 1 tablet (5 mg total) by mouth 2 (two) times daily as needed for muscle spasms. 10 tablet 0   diclofenac (VOLTAREN) 75 MG EC tablet Take 1 tablet (75 mg total) by mouth 2 (two) times daily. 14 tablet 0   ibuprofen (ADVIL,MOTRIN) 600 MG tablet Take 1 tablet (600 mg total) by mouth every 6 (six) hours as needed. 30 tablet 0   lidocaine (LIDODERM) 5 % Place 1 patch onto the skin daily. Remove & Discard patch within 12 hours or as directed by MD 10 patch 0   meclizine (ANTIVERT) 12.5 MG tablet Take 1 tablet (12.5 mg total) by mouth 3 (three) times daily as needed for dizziness. 21 tablet 0   ondansetron (ZOFRAN ODT) 4 MG disintegrating tablet Take 1 tablet (4 mg total) by mouth every 8 (eight) hours as needed for nausea or vomiting. 8 tablet 0   No current facility-administered medications on file prior to visit.     Ht 5\' 5"  (1.651 m)    Wt 130 lb (59 kg)    BMI 21.63 kg/m       Objective:   Physical Exam        Assessment & Plan:

## 2018-09-01 NOTE — Patient Instructions (Signed)
For allergic rhinitis, I rx'd xyzal, motelukast and flonase nasal spray.  No sinus pressure presently so doubt sinus infection.  I do think triple combination therapy will be helpful. If not might arrange for depomedrol injection or allergist appointment,.  Follow up in 10 days or as needed

## 2018-09-03 DIAGNOSIS — N946 Dysmenorrhea, unspecified: Secondary | ICD-10-CM | POA: Diagnosis not present

## 2018-09-03 DIAGNOSIS — N921 Excessive and frequent menstruation with irregular cycle: Secondary | ICD-10-CM | POA: Diagnosis not present

## 2018-09-03 DIAGNOSIS — Z3041 Encounter for surveillance of contraceptive pills: Secondary | ICD-10-CM | POA: Diagnosis not present

## 2018-09-03 DIAGNOSIS — D500 Iron deficiency anemia secondary to blood loss (chronic): Secondary | ICD-10-CM | POA: Diagnosis not present

## 2018-09-09 ENCOUNTER — Ambulatory Visit (INDEPENDENT_AMBULATORY_CARE_PROVIDER_SITE_OTHER): Payer: BC Managed Care – PPO | Admitting: Medical

## 2018-09-09 ENCOUNTER — Other Ambulatory Visit: Payer: Self-pay

## 2018-09-09 DIAGNOSIS — J301 Allergic rhinitis due to pollen: Secondary | ICD-10-CM

## 2018-09-09 DIAGNOSIS — E611 Iron deficiency: Secondary | ICD-10-CM

## 2018-09-09 MED ORDER — LEVOCETIRIZINE DIHYDROCHLORIDE 5 MG PO TABS: 5.0000 mg | ORAL_TABLET | Freq: Every evening | ORAL | 3 refills | Status: AC

## 2018-09-09 MED ORDER — MONTELUKAST SODIUM 10 MG PO TABS: 10.0000 mg | ORAL_TABLET | Freq: Every day | ORAL | 3 refills | Status: AC

## 2018-09-09 MED ORDER — FLUTICASONE PROPIONATE 50 MCG/ACT NA SUSP: 2.0000 | Freq: Every day | NASAL | 11 refills | Status: DC

## 2018-09-09 NOTE — Patient Instructions (Addendum)
Allergies much improved/resoved symptoms since last visit Refilled 3 allergy med regmin for one year. Advised take daily and avoid flares.  For low iron follow up with hematologist for likely iron infusion. Update me by my chart if they gave.  Follow up next appointment will recommend wellness exam. Early fall or spring.

## 2018-09-09 NOTE — Progress Notes (Signed)
   Subjective:    Patient ID: Sara Riley, female    DOB: 02/21/1976, 43 y.o.   MRN: [id number]  HPI  Virtual Visit via Video Note  I connected with Sara Riley on 09/09/18 at  3:20 PM EDT by a video enabled telemedicine application and verified that I am speaking with the correct person using two identifiers.  Location: Patient: [facility] Provider: HOME.   I discussed the limitations of evaluation and management by telemedicine and the availability of in person appointments. The patient expressed understanding and agreed to proceed.  Marland Kitchen History of Present Illness:  Pt states her allergies cleared up with her 3 medication regimen. No sinus pressure. All allergy symptoms resolved.  Pt not having any HA presently. She has low iron despite use of oral iron. She has appointment with hematologist in [place] for possible iron infusion. He gyn that has been following iron level think her ha maybe low iron related.     Observations/Objective: General-no acute distress, pleasant, oriented. Lungs- on inspection lungs appear unlabored. Neck- no tracheal deviation or jvd on inspection. Neuro- gross motor function appears intact.  Assessment and Plan: Allergies much improved/resoved symptoms since last visit Refilled 3 allergy med regmin for one year. Advised take daily and avoid flares.  For low iron follow up with hematologist for likely iron infusion. Update me by my chart if they gave.  Follow up next appointment will recommend wellness exam. Early fall or spring.  Follow Up Instructions:    I discussed the assessment and treatment plan with the patient. The patient was provided an opportunity to ask questions and all were answered. The patient agreed with the plan and demonstrated an understanding of the instructions.   The patient was advised to call back or seek an in-person evaluation if the symptoms worsen or if the condition fails to improve as anticipated.     Esperanza Richters, PA-C    Review of Systems  Constitutional: Positive for fatigue. Negative for chills and fever.       With low iron.  HENT: Negative for congestion, postnasal drip, sinus pressure, sinus pain, sneezing and sore throat.   Respiratory: Negative for cough, chest tightness and wheezing.   Cardiovascular: Negative for chest pain and palpitations.  Gastrointestinal: Negative for abdominal pain.  Musculoskeletal: Negative for back pain and neck pain.  Skin: Negative for rash.  Neurological: Negative for dizziness and headaches.  Hematological: Negative for adenopathy. Does not bruise/bleed easily.  Psychiatric/Behavioral: Negative for behavioral problems.       Objective:   Physical Exam        Assessment & Plan:

## 2018-09-11 ENCOUNTER — Ambulatory Visit: Payer: BLUE CROSS/BLUE SHIELD | Admitting: Medical

## 2018-09-11 DIAGNOSIS — D509 Iron deficiency anemia, unspecified: Secondary | ICD-10-CM | POA: Diagnosis not present

## 2018-09-11 DIAGNOSIS — N921 Excessive and frequent menstruation with irregular cycle: Secondary | ICD-10-CM | POA: Diagnosis not present

## 2018-09-11 DIAGNOSIS — D500 Iron deficiency anemia secondary to blood loss (chronic): Secondary | ICD-10-CM | POA: Diagnosis not present

## 2018-09-14 ENCOUNTER — Encounter: Payer: Self-pay | Admitting: Medical

## 2018-09-15 ENCOUNTER — Ambulatory Visit: Payer: BC Managed Care – PPO | Admitting: Medical

## 2018-09-18 DIAGNOSIS — D509 Iron deficiency anemia, unspecified: Secondary | ICD-10-CM | POA: Diagnosis not present

## 2018-09-18 DIAGNOSIS — D500 Iron deficiency anemia secondary to blood loss (chronic): Secondary | ICD-10-CM | POA: Diagnosis not present

## 2018-09-18 DIAGNOSIS — N921 Excessive and frequent menstruation with irregular cycle: Secondary | ICD-10-CM | POA: Diagnosis not present

## 2018-09-25 DIAGNOSIS — D500 Iron deficiency anemia secondary to blood loss (chronic): Secondary | ICD-10-CM | POA: Diagnosis not present

## 2018-09-25 DIAGNOSIS — N921 Excessive and frequent menstruation with irregular cycle: Secondary | ICD-10-CM | POA: Diagnosis not present

## 2018-09-25 DIAGNOSIS — D509 Iron deficiency anemia, unspecified: Secondary | ICD-10-CM | POA: Diagnosis not present

## 2018-09-29 DIAGNOSIS — H9311 Tinnitus, right ear: Secondary | ICD-10-CM | POA: Diagnosis not present

## 2018-09-29 DIAGNOSIS — H9041 Sensorineural hearing loss, unilateral, right ear, with unrestricted hearing on the contralateral side: Secondary | ICD-10-CM | POA: Diagnosis not present

## 2018-09-29 DIAGNOSIS — H9121 Sudden idiopathic hearing loss, right ear: Secondary | ICD-10-CM | POA: Diagnosis not present

## 2018-12-18 ENCOUNTER — Other Ambulatory Visit: Payer: Self-pay

## 2018-12-18 ENCOUNTER — Ambulatory Visit: Payer: BC Managed Care – PPO | Admitting: Medical

## 2018-12-18 ENCOUNTER — Encounter: Payer: Self-pay | Admitting: Medical

## 2018-12-18 VITALS — BP 113/53 | HR 73

## 2018-12-18 DIAGNOSIS — D500 Iron deficiency anemia secondary to blood loss (chronic): Secondary | ICD-10-CM | POA: Diagnosis not present

## 2018-12-18 DIAGNOSIS — T7840XA Allergy, unspecified, initial encounter: Secondary | ICD-10-CM | POA: Diagnosis not present

## 2018-12-18 DIAGNOSIS — Z79899 Other long term (current) drug therapy: Secondary | ICD-10-CM | POA: Diagnosis not present

## 2018-12-18 DIAGNOSIS — N921 Excessive and frequent menstruation with irregular cycle: Secondary | ICD-10-CM | POA: Diagnosis not present

## 2018-12-18 MED ORDER — HYDROXYZINE HCL 25 MG PO TABS: 25.0000 mg | ORAL_TABLET | Freq: Three times a day (TID) | ORAL | 0 refills | Status: AC | PRN

## 2018-12-18 MED ORDER — METHYLPREDNISOLONE ACETATE 40 MG/ML IJ SUSP
40.0000 mg | Freq: Once | INTRAMUSCULAR | Status: AC
  Administered 2018-12-18: 40 mg via INTRAMUSCULAR

## 2018-12-18 MED ORDER — PREDNISONE 10 MG PO TABS: ORAL_TABLET | ORAL | 0 refills | Status: AC

## 2018-12-18 NOTE — Progress Notes (Signed)
Subjective:    Patient ID: Sara MillerAmber M Riley, female    DOB: 04/14/1975, 43 y.o.   MRN: [id number]  HPI  Patient here with complaints of rash and itching. States she was doing yard work Monday. Rash started on fingers of right hand. By Monday night, she had a few areas under her right eye and face. Now has spread to left upper arm and bilateral inner upper legs. States she is not scratching, has been washing clothes and sheets, but thinks she may be accidentally scratching in her sleep. No new soaps, detergents, foods. She has tried aloe and bleach, which usually helps her poison ivy, but it has not helped at all this time. Most irritated on fingers and face; blisters itch and pain 10/10 when touched.  Has had poison ivy/oak multiple times but has never had to come in for evaluation.  LMP- November 30, 2018.    Review of Systems  Constitutional: Negative for chills, fatigue and fever.  Respiratory: Negative for chest tightness and shortness of breath.   Cardiovascular: Negative for chest pain and palpitations.  Musculoskeletal: Negative for arthralgias and joint swelling.  Skin: Positive for rash.       Painful, itchy blisters to right hand (middle and ring finger), left upper arm, bilateral inner thighs, right face, right eyelid  Neurological: Negative for dizziness and light-headedness.    Past Medical History:  Diagnosis Date  . Anemia   . Headache    migraine in past.     Social History   Socioeconomic History  . Marital status: Single    Spouse name: Not on file  . Number of children: 2  . Years of education: Not on file  . Highest education level: Not on file  Occupational History  . Occupation: [employer]  Social Needs  . Financial resource strain: Not hard at all  . Food insecurity    Worry: Never true    Inability: Never true  . Transportation needs    Medical: No    Non-medical: No  Tobacco Use  . Smoking status: Never Smoker  . Smokeless tobacco: Never Used   Substance and Sexual Activity  . Alcohol use: Never    Frequency: Never  . Drug use: Never  . Sexual activity: Not on file  Lifestyle  . Physical activity    Days per week: 3 days    Minutes per session: 60 min  . Stress: Rather much  Relationships  . Social Musicianconnections    Talks on phone: Never    Gets together: Once a week    Attends religious service: Never    Active member of club or organization: No    Attends meetings of clubs or organizations: Never    Relationship status: Living with partner  . Intimate partner violence    Fear of current or ex partner: No    Emotionally abused: No    Physically abused: No    Forced sexual activity: No  Other Topics Concern  . Not on file  Social History Narrative  . Not on file    Past Surgical History:  Procedure Laterality Date  . CESAREAN SECTION     2005/2007    Family History  Problem Relation Age of Onset  . Diabetes Father   . Heart failure Father   . Hyperlipidemia Father   . Hypertension Father   . Hearing loss Father   . Birth defects Daughter   . Hypertension Daughter   . Kidney disease Daughter  Allergies  Allergen Reactions  . Acetaminophen Other (See Comments)  . Hydrocodone Other (See Comments)  . Penicillin G Other (See Comments)  . Phenergan [Promethazine]     tremors  . Propoxyphene Other (See Comments)    Current Outpatient Medications on File Prior to Visit  Medication Sig Dispense Refill  . ibuprofen (ADVIL,MOTRIN) 600 MG tablet Take 1 tablet (600 mg total) by mouth every 6 (six) hours as needed. 30 tablet 0  . levocetirizine (XYZAL) 5 MG tablet Take 1 tablet (5 mg total) by mouth every evening. 90 tablet 3  . lidocaine (LIDODERM) 5 % Place 1 patch onto the skin daily. Remove & Discard patch within 12 hours or as directed by MD 10 patch 0  . meclizine (ANTIVERT) 12.5 MG tablet Take 1 tablet (12.5 mg total) by mouth 3 (three) times daily as needed for dizziness. 21 tablet 0  . montelukast  (SINGULAIR) 10 MG tablet Take 1 tablet (10 mg total) by mouth at bedtime. 90 tablet 3  . ondansetron (ZOFRAN ODT) 4 MG disintegrating tablet Take 1 tablet (4 mg total) by mouth every 8 (eight) hours as needed for nausea or vomiting. 8 tablet 0   No current facility-administered medications on file prior to visit.     There were no vitals taken for this visit.      Objective:   Physical Exam   General- No acute distress. Pleasant patient. Skin- rt side face, left bicep, medial thighs, and rt 3rd digit are region of papular rash. However rt 3rd digit mild blistered appearance. Not so vesicles on pt pt.      Assessment & Plan:  You recently have probable  allergic reaction to poison ivy or poison oak based on your described hx. We gave you depo-medrol im injection. I am also prescribing oral prednisone and hydroxyzine for itching. Your rash should gradually improve. If worsening or expanding please notify us.  Rx advisement given  Follow up in 7 days or as needed.  Mackie Pai, PA-C

## 2018-12-18 NOTE — Patient Instructions (Addendum)
You recently have probable  allergic reaction to poison ivy or poison oak based on your described hx. We gave you depo-medrol im injection. I am also prescribing oral prednisone and hydroxyzine for itching. Your rash should gradually improve. If worsening or expanding please notify us.  Rx advisement given  Follow up in 7 days or as needed.

## 2019-04-19 DIAGNOSIS — D500 Iron deficiency anemia secondary to blood loss (chronic): Secondary | ICD-10-CM | POA: Diagnosis not present

## 2019-04-19 DIAGNOSIS — D751 Secondary polycythemia: Secondary | ICD-10-CM | POA: Diagnosis not present

## 2019-04-19 DIAGNOSIS — N921 Excessive and frequent menstruation with irregular cycle: Secondary | ICD-10-CM | POA: Diagnosis not present

## 2019-04-19 DIAGNOSIS — Z793 Long term (current) use of hormonal contraceptives: Secondary | ICD-10-CM | POA: Diagnosis not present

## 2019-04-19 DIAGNOSIS — D696 Thrombocytopenia, unspecified: Secondary | ICD-10-CM | POA: Diagnosis not present

## 2019-04-19 DIAGNOSIS — Z79899 Other long term (current) drug therapy: Secondary | ICD-10-CM | POA: Diagnosis not present

## 2019-06-25 DIAGNOSIS — Z1231 Encounter for screening mammogram for malignant neoplasm of breast: Secondary | ICD-10-CM | POA: Diagnosis not present

## 2019-11-29 DIAGNOSIS — Z793 Long term (current) use of hormonal contraceptives: Secondary | ICD-10-CM | POA: Diagnosis not present

## 2019-11-29 DIAGNOSIS — N925 Other specified irregular menstruation: Secondary | ICD-10-CM | POA: Diagnosis not present

## 2019-11-29 DIAGNOSIS — Z01419 Encounter for gynecological examination (general) (routine) without abnormal findings: Secondary | ICD-10-CM | POA: Diagnosis not present

## 2020-03-17 IMAGING — CT CT HEAD W/O CM
3 series · 16 of 47 positions shown, 19 images · non-contrast
Comparison: None.

CLINICAL DATA: Migraine headache and vomiting.

EXAM:
CT HEAD WITHOUT CONTRAST
TECHNIQUE: Contiguous axial images were obtained from the base of the skull
through the vertex without intravenous contrast.

[Series 2: head wo · axial · 0.44mm/px · z∈[-154,-24]mm · 10 of 32 slices shown, 13 images]
[im 3/32  brain]
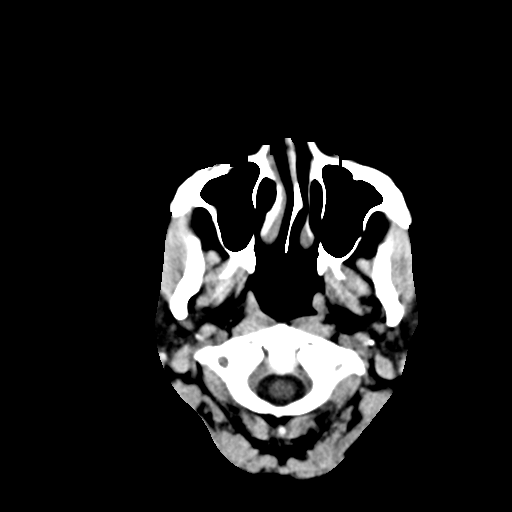
[im 3/32  bone]
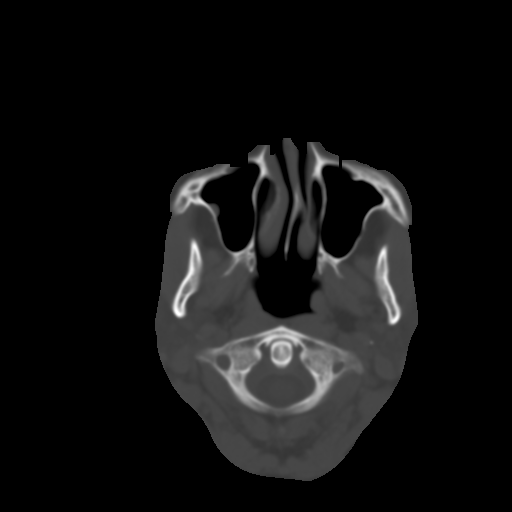
[im 6/32  brain]
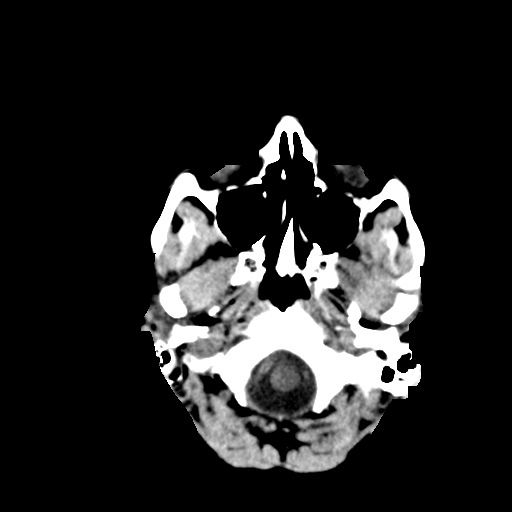
[im 9/32  brain]
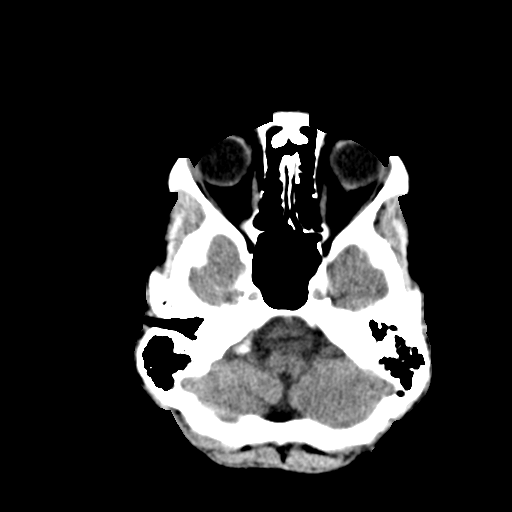
[im 11/32  brain]
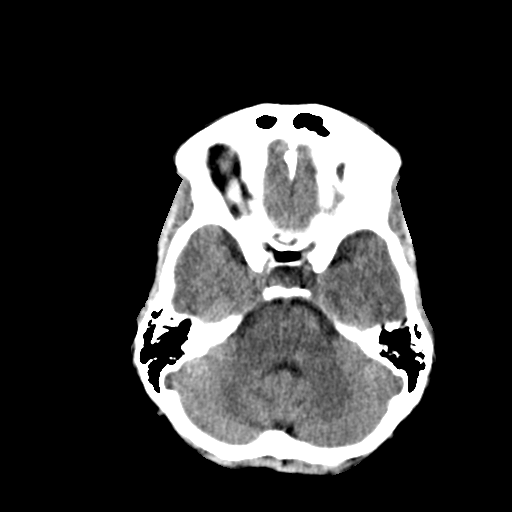
[im 14/32  brain]
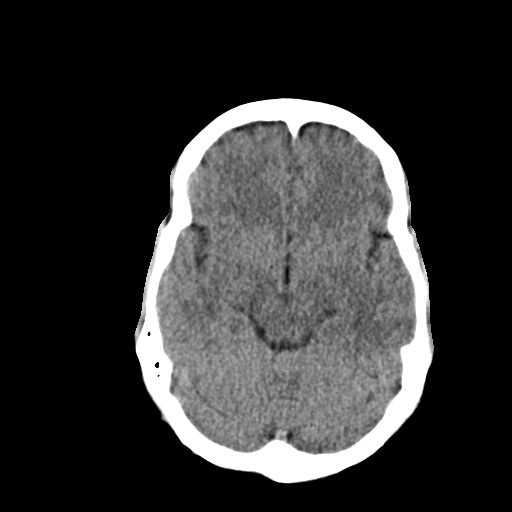
[im 14/32  bone]
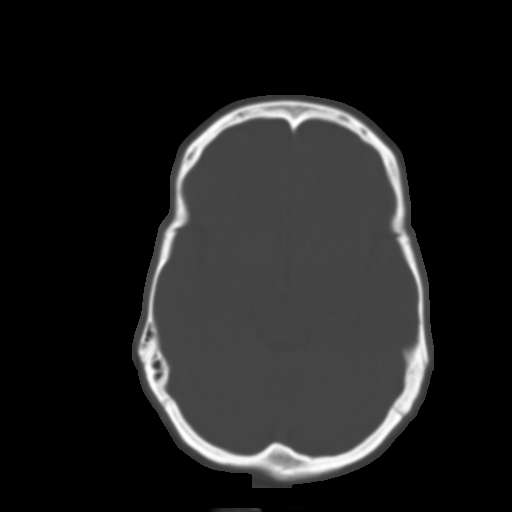
[im 18/32  brain]
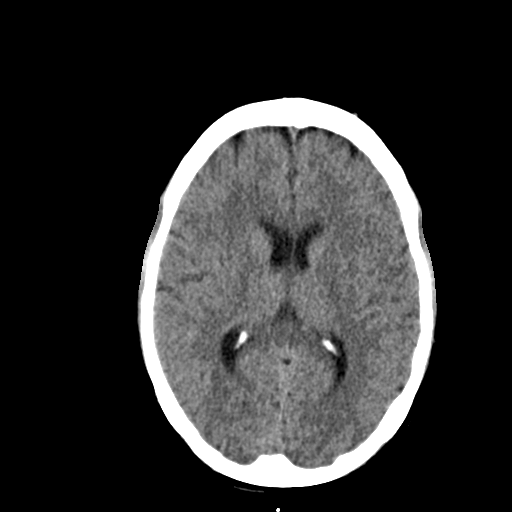
[im 21/32  brain]
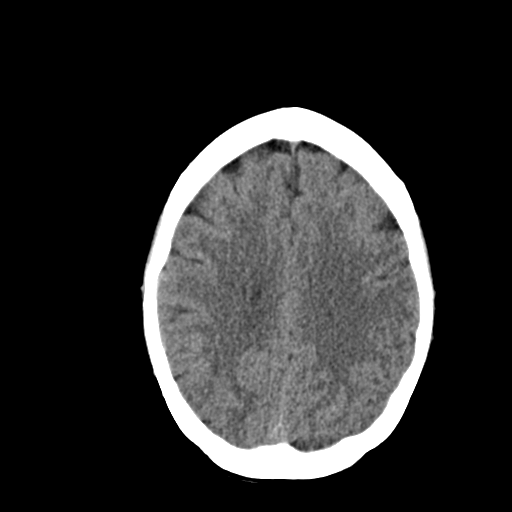
[im 24/32  brain]
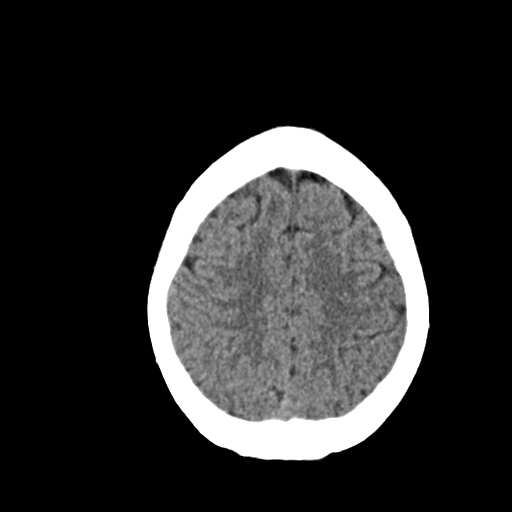
[im 26/32  brain]
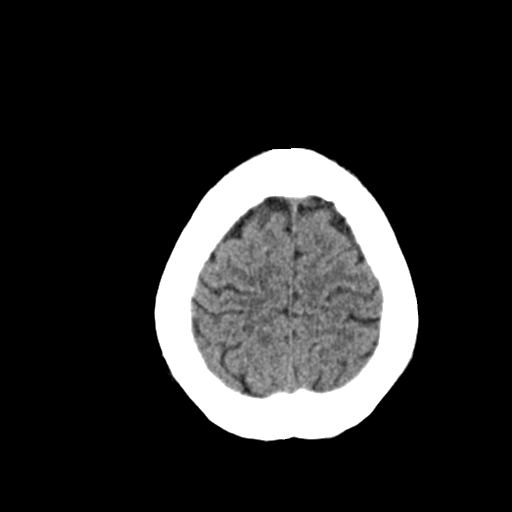
[im 26/32  bone]
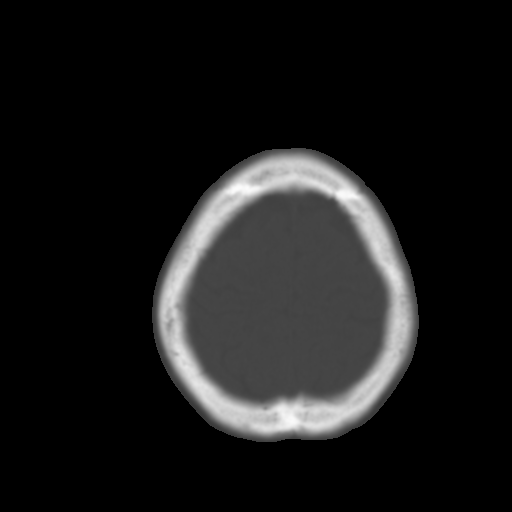
[im 29/32  brain]
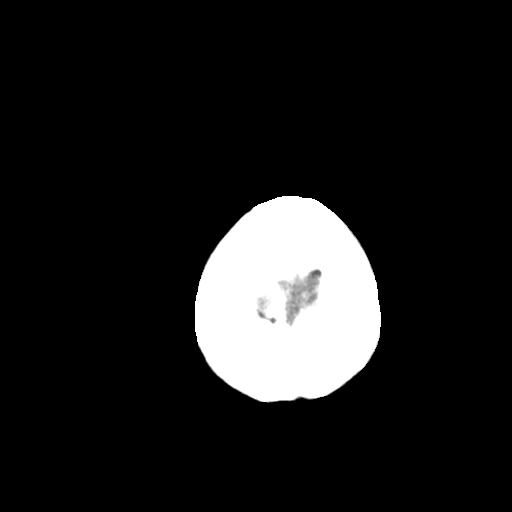

[Series 4: coronal soft · coronal · 0.33mm/px · 3 of 62 slices shown]
[im 21/62  brain]
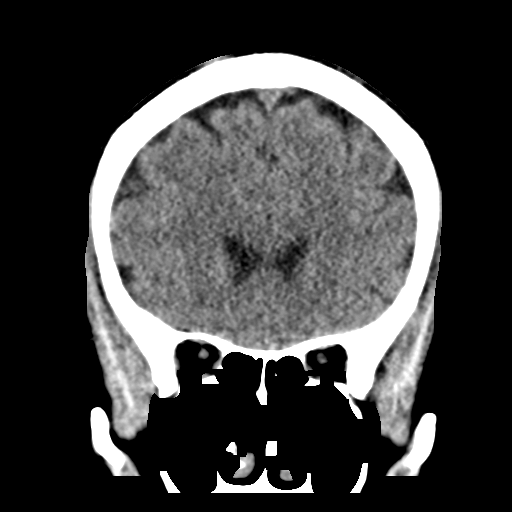
[im 28/62  brain]
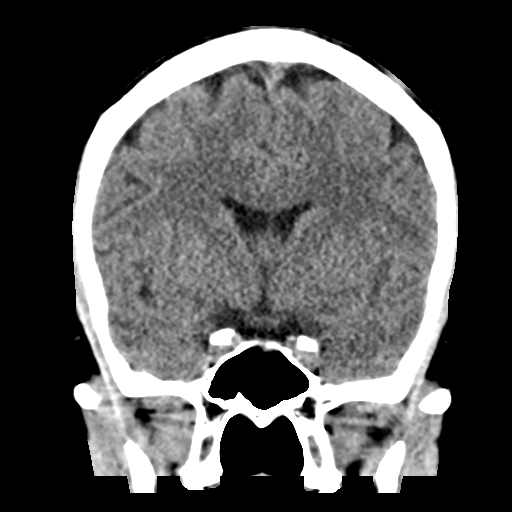
[im 34/62  brain]
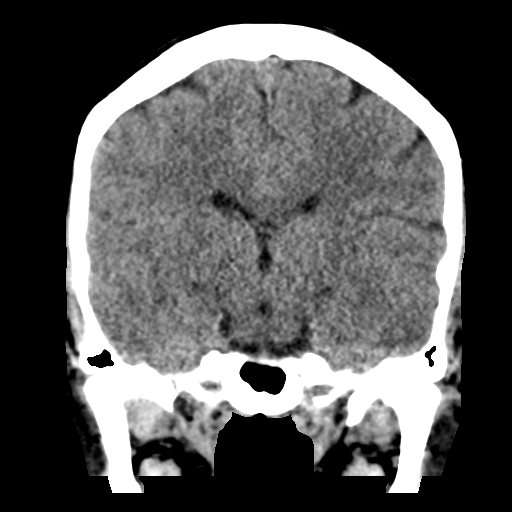

[Series 5: sag soft · sagittal · 0.31mm/px · 3 of 52 slices shown]
[im 18/52  brain]
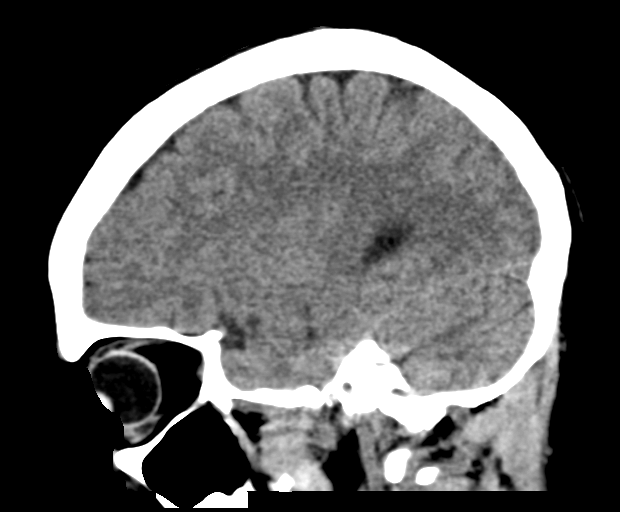
[im 26/52  brain]
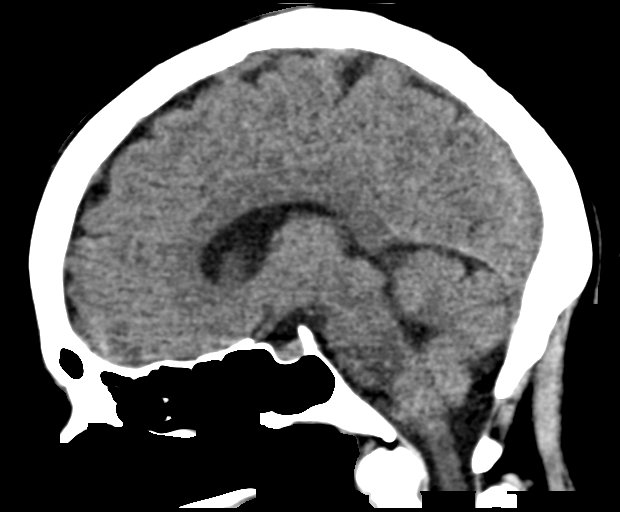
[im 35/52  brain]
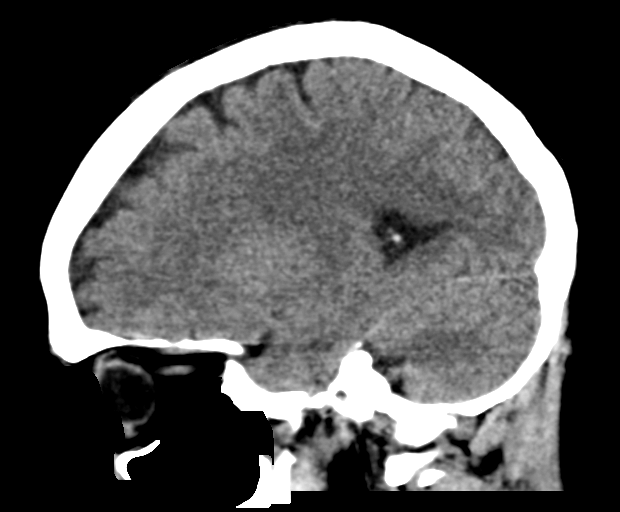

[16 of 47 positions shown; findings below may reference images not displayed]

FINDINGS: Brain: No evidence of acute infarction, hemorrhage, hydrocephalus,
extra-axial collection or mass lesion/mass effect.

Vascular: No hyperdense vessel or unexpected calcification.

Skull: Normal. Negative for fracture or focal lesion.

Sinuses/Orbits: No acute finding.

Other: None.
IMPRESSION: Normal head CT.

## 2020-04-14 ENCOUNTER — Other Ambulatory Visit: Payer: Self-pay

## 2020-04-14 ENCOUNTER — Other Ambulatory Visit: Payer: Medicaid Other

## 2020-04-14 DIAGNOSIS — Z20822 Contact with and (suspected) exposure to covid-19: Secondary | ICD-10-CM | POA: Diagnosis not present

## 2020-04-17 LAB — NOVEL CORONAVIRUS, NAA: SARS-CoV-2, NAA: NOT DETECTED

## 2020-04-20 ENCOUNTER — Other Ambulatory Visit: Payer: Medicaid Other

## 2020-04-20 DIAGNOSIS — Z20822 Contact with and (suspected) exposure to covid-19: Secondary | ICD-10-CM

## 2020-04-25 LAB — NOVEL CORONAVIRUS, NAA: SARS-CoV-2, NAA: NOT DETECTED

## 2020-05-01 ENCOUNTER — Other Ambulatory Visit: Payer: Self-pay

## 2020-05-01 ENCOUNTER — Other Ambulatory Visit: Payer: Medicaid Other

## 2020-05-01 DIAGNOSIS — Z1152 Encounter for screening for COVID-19: Secondary | ICD-10-CM

## 2020-05-02 LAB — NOVEL CORONAVIRUS, NAA: SARS-CoV-2, NAA: NOT DETECTED

## 2020-05-02 LAB — SARS-COV-2, NAA 2 DAY TAT

## 2020-05-08 ENCOUNTER — Other Ambulatory Visit: Payer: Medicaid Other

## 2020-05-08 DIAGNOSIS — Z20822 Contact with and (suspected) exposure to covid-19: Secondary | ICD-10-CM

## 2020-05-10 LAB — SARS-COV-2, NAA 2 DAY TAT

## 2020-05-10 LAB — NOVEL CORONAVIRUS, NAA: SARS-CoV-2, NAA: NOT DETECTED

## 2020-05-15 ENCOUNTER — Other Ambulatory Visit: Payer: Medicaid Other

## 2020-05-16 ENCOUNTER — Other Ambulatory Visit: Payer: Medicaid Other

## 2020-05-18 ENCOUNTER — Other Ambulatory Visit: Payer: Medicaid Other

## 2020-05-18 DIAGNOSIS — Z20822 Contact with and (suspected) exposure to covid-19: Secondary | ICD-10-CM | POA: Diagnosis not present

## 2020-05-19 LAB — NOVEL CORONAVIRUS, NAA: SARS-CoV-2, NAA: NOT DETECTED

## 2020-05-19 LAB — SARS-COV-2, NAA 2 DAY TAT

## 2020-05-22 ENCOUNTER — Other Ambulatory Visit: Payer: Medicaid Other

## 2020-05-22 DIAGNOSIS — Z20822 Contact with and (suspected) exposure to covid-19: Secondary | ICD-10-CM

## 2020-05-23 ENCOUNTER — Other Ambulatory Visit: Payer: Medicaid Other

## 2020-05-23 LAB — SARS-COV-2, NAA 2 DAY TAT

## 2020-05-23 LAB — NOVEL CORONAVIRUS, NAA: SARS-CoV-2, NAA: NOT DETECTED

## 2020-05-30 ENCOUNTER — Other Ambulatory Visit: Payer: Medicaid Other

## 2020-05-30 DIAGNOSIS — Z20822 Contact with and (suspected) exposure to covid-19: Secondary | ICD-10-CM

## 2020-05-31 LAB — NOVEL CORONAVIRUS, NAA: SARS-CoV-2, NAA: NOT DETECTED

## 2020-05-31 LAB — SARS-COV-2, NAA 2 DAY TAT

## 2020-06-01 ENCOUNTER — Other Ambulatory Visit: Payer: Medicaid Other

## 2020-06-06 ENCOUNTER — Other Ambulatory Visit: Payer: Medicaid Other

## 2020-06-06 DIAGNOSIS — Z20822 Contact with and (suspected) exposure to covid-19: Secondary | ICD-10-CM

## 2020-06-07 LAB — NOVEL CORONAVIRUS, NAA: SARS-CoV-2, NAA: NOT DETECTED

## 2020-06-07 LAB — SARS-COV-2, NAA 2 DAY TAT

## 2020-06-13 ENCOUNTER — Other Ambulatory Visit: Payer: Medicaid Other

## 2020-06-13 DIAGNOSIS — Z20822 Contact with and (suspected) exposure to covid-19: Secondary | ICD-10-CM | POA: Diagnosis not present

## 2020-06-14 LAB — NOVEL CORONAVIRUS, NAA: SARS-CoV-2, NAA: NOT DETECTED

## 2020-06-14 LAB — SARS-COV-2, NAA 2 DAY TAT

## 2020-06-20 ENCOUNTER — Ambulatory Visit: Payer: Medicaid Other | Attending: Critical Care Medicine

## 2020-06-20 DIAGNOSIS — Z20822 Contact with and (suspected) exposure to covid-19: Secondary | ICD-10-CM

## 2020-06-21 LAB — SARS-COV-2, NAA 2 DAY TAT

## 2020-06-21 LAB — NOVEL CORONAVIRUS, NAA: SARS-CoV-2, NAA: NOT DETECTED

## 2020-06-27 ENCOUNTER — Ambulatory Visit: Payer: Medicaid Other | Attending: Internal Medicine

## 2020-06-27 DIAGNOSIS — Z20822 Contact with and (suspected) exposure to covid-19: Secondary | ICD-10-CM | POA: Diagnosis not present

## 2020-06-28 LAB — SARS-COV-2, NAA 2 DAY TAT

## 2020-06-28 LAB — NOVEL CORONAVIRUS, NAA: SARS-CoV-2, NAA: NOT DETECTED

## 2020-07-04 ENCOUNTER — Ambulatory Visit: Payer: Medicaid Other | Attending: Internal Medicine

## 2020-07-04 DIAGNOSIS — Z20822 Contact with and (suspected) exposure to covid-19: Secondary | ICD-10-CM | POA: Diagnosis not present

## 2020-07-05 LAB — SARS-COV-2, NAA 2 DAY TAT

## 2020-07-05 LAB — NOVEL CORONAVIRUS, NAA: SARS-CoV-2, NAA: NOT DETECTED

## 2020-07-11 ENCOUNTER — Ambulatory Visit: Payer: Medicaid Other | Attending: Critical Care Medicine

## 2020-07-11 DIAGNOSIS — Z20822 Contact with and (suspected) exposure to covid-19: Secondary | ICD-10-CM

## 2020-07-12 LAB — SARS-COV-2, NAA 2 DAY TAT

## 2020-07-12 LAB — NOVEL CORONAVIRUS, NAA: SARS-CoV-2, NAA: NOT DETECTED

## 2020-07-13 DIAGNOSIS — Z1231 Encounter for screening mammogram for malignant neoplasm of breast: Secondary | ICD-10-CM | POA: Diagnosis not present

## 2020-07-18 ENCOUNTER — Other Ambulatory Visit: Payer: Medicaid Other

## 2020-07-20 ENCOUNTER — Ambulatory Visit: Payer: Medicaid Other | Attending: Internal Medicine

## 2020-07-20 DIAGNOSIS — Z20822 Contact with and (suspected) exposure to covid-19: Secondary | ICD-10-CM | POA: Diagnosis not present

## 2020-07-22 LAB — NOVEL CORONAVIRUS, NAA: SARS-CoV-2, NAA: NOT DETECTED

## 2020-07-22 LAB — SARS-COV-2, NAA 2 DAY TAT

## 2020-07-25 ENCOUNTER — Ambulatory Visit: Payer: Medicaid Other | Attending: Critical Care Medicine

## 2020-07-25 DIAGNOSIS — Z20822 Contact with and (suspected) exposure to covid-19: Secondary | ICD-10-CM

## 2020-07-27 LAB — SARS-COV-2, NAA 2 DAY TAT

## 2020-07-27 LAB — NOVEL CORONAVIRUS, NAA: SARS-CoV-2, NAA: NOT DETECTED

## 2020-08-01 ENCOUNTER — Ambulatory Visit: Payer: Medicaid Other | Attending: Internal Medicine

## 2020-08-01 DIAGNOSIS — Z20822 Contact with and (suspected) exposure to covid-19: Secondary | ICD-10-CM | POA: Diagnosis not present

## 2020-08-02 LAB — NOVEL CORONAVIRUS, NAA: SARS-CoV-2, NAA: NOT DETECTED

## 2020-08-02 LAB — SARS-COV-2, NAA 2 DAY TAT

## 2020-08-08 DIAGNOSIS — M25775 Osteophyte, left foot: Secondary | ICD-10-CM | POA: Diagnosis not present

## 2020-08-08 DIAGNOSIS — B351 Tinea unguium: Secondary | ICD-10-CM | POA: Diagnosis not present

## 2020-11-02 DIAGNOSIS — D225 Melanocytic nevi of trunk: Secondary | ICD-10-CM | POA: Diagnosis not present

## 2020-11-02 DIAGNOSIS — L720 Epidermal cyst: Secondary | ICD-10-CM | POA: Diagnosis not present

## 2020-12-08 DIAGNOSIS — N841 Polyp of cervix uteri: Secondary | ICD-10-CM | POA: Diagnosis not present

## 2020-12-08 DIAGNOSIS — Z9851 Tubal ligation status: Secondary | ICD-10-CM | POA: Diagnosis not present

## 2020-12-08 DIAGNOSIS — Z01411 Encounter for gynecological examination (general) (routine) with abnormal findings: Secondary | ICD-10-CM | POA: Diagnosis not present

## 2020-12-08 DIAGNOSIS — Z793 Long term (current) use of hormonal contraceptives: Secondary | ICD-10-CM | POA: Diagnosis not present

## 2020-12-08 DIAGNOSIS — N72 Inflammatory disease of cervix uteri: Secondary | ICD-10-CM | POA: Diagnosis not present

## 2020-12-21 DIAGNOSIS — D229 Melanocytic nevi, unspecified: Secondary | ICD-10-CM | POA: Diagnosis not present

## 2020-12-28 ENCOUNTER — Encounter: Payer: Self-pay | Admitting: Medical

## 2021-01-02 ENCOUNTER — Ambulatory Visit: Payer: Medicaid Other | Admitting: Family Medicine

## 2021-01-09 DIAGNOSIS — D2271 Melanocytic nevi of right lower limb, including hip: Secondary | ICD-10-CM | POA: Diagnosis not present

## 2021-01-09 DIAGNOSIS — D225 Melanocytic nevi of trunk: Secondary | ICD-10-CM | POA: Diagnosis not present
